# Patient Record
Sex: Female | Born: 1995 | Race: Black or African American | Hispanic: No | Marital: Single | Smoking: Never smoker
Health system: Southern US, Community
[De-identification: ages and names within clinical notes are randomized; demographics above are authoritative.]

## PROBLEM LIST (undated history)

## (undated) HISTORY — PX: WISDOM TOOTH EXTRACTION: SHX21

---

## 2016-06-11 ENCOUNTER — Encounter (HOSPITAL_COMMUNITY): Payer: Self-pay

## 2016-06-11 ENCOUNTER — Ambulatory Visit (HOSPITAL_COMMUNITY)
Admission: EM | Admit: 2016-06-11 | Discharge: 2016-06-11 | Disposition: A | Discharge status: Home / Self Care | Payer: BLUE CROSS/BLUE SHIELD | Attending: Radiology | Admitting: Radiology

## 2016-06-11 DIAGNOSIS — R6889 Other general symptoms and signs: Secondary | ICD-10-CM

## 2016-06-11 MED ORDER — METHYLPREDNISOLONE SODIUM SUCC 125 MG IJ SOLR
INTRAMUSCULAR | Status: AC
  Filled 2016-06-11: qty 2

## 2016-06-11 MED ORDER — METHYLPREDNISOLONE SODIUM SUCC 125 MG IJ SOLR
80.0000 mg | Freq: Once | INTRAMUSCULAR | Status: AC
  Administered 2016-06-11: 80 mg via INTRAMUSCULAR

## 2016-06-11 NOTE — ED Triage Notes (Signed)
[facility] since last night 06/10/2016, pt has taken Thera Flu to treat symptoms but has no relief.

## 2016-06-11 NOTE — Discharge Instructions (Signed)
Continue to push fluids and take over the counter medications as directed on the back of the box for symptomatic relief.  ° °

## 2016-06-11 NOTE — ED Notes (Signed)
Patient discharged by provider.

## 2016-06-11 NOTE — ED Provider Notes (Signed)
CSN: [id number]     Arrival date & time 06/11/16  1656 History   None    Chief Complaint  Patient presents with  . Influenza   (Consider location/radiation/quality/duration/timing/severity/associated sxs/prior Treatment) 21 y.o. female presents with sudden onset of headache, generalized muscles aches and sujective fever that occurred at 21:00 last night Condition is acute in nature. Condition is made better by nothing. Condition is made worse by nothing. Patient denies any relief from OTC medications prior to there arrival at this facility. Patient declines tamiflu at this time       History reviewed. No pertinent past medical history. History reviewed. No pertinent surgical history. History reviewed. No pertinent family history. Social History  Substance Use Topics  . Smoking status: Never Smoker  . Smokeless tobacco: Never Used  . Alcohol use No   OB History    No data available     Review of Systems  Constitutional: Positive for chills, diaphoresis and fever.  HENT: Positive for congestion. Negative for ear pain and sore throat.   Eyes: Negative for pain and visual disturbance.  Respiratory: Negative for cough and shortness of breath.   Cardiovascular: Negative for chest pain and palpitations.  Gastrointestinal: Negative for abdominal pain and vomiting.  Genitourinary: Negative for dysuria and hematuria.  Musculoskeletal: Negative for arthralgias and back pain.  Skin: Negative for color change and rash.  Neurological: Positive for headaches. Negative for seizures and syncope.  All other systems reviewed and are negative.   Allergies  Penicillins  Home Medications   Prior to Admission medications   Not on File   Meds Ordered and Administered this Visit   Medications  methylPREDNISolone sodium succinate (SOLU-MEDROL) 125 mg/2 mL injection 80 mg (80 mg Intramuscular Given 06/11/16 1816)    BP 94/80 (BP Location: Right Arm)   Pulse 96   Temp 97.6 F (36.4 C)  (Oral)   Resp 17   LMP 06/04/2016 (Exact Date)   SpO2 100%  No data found.   Physical Exam  Constitutional: She is oriented to person, place, and time. She appears well-developed and well-nourished.  HENT:  Head: Normocephalic and atraumatic.  Eyes: Conjunctivae are normal.  Neck: Normal range of motion.  Cardiovascular: Normal rate and regular rhythm.   Pulmonary/Chest: Effort normal and breath sounds normal.  Musculoskeletal: Normal range of motion.  Neurological: She is alert and oriented to person, place, and time.  Skin: Skin is warm.  Psychiatric: She has a normal mood and affect.  Nursing note and vitals reviewed.   Urgent Care Course     Procedures (including critical care time)  Labs Review Labs Reviewed - No data to display  Imaging Review No results found.      MDM   1. Flu-like symptoms       Alene MiresJennifer C Kaleel Schmieder, NP 06/11/16 1844

## 2016-06-15 ENCOUNTER — Ambulatory Visit (HOSPITAL_COMMUNITY)
Admission: EM | Admit: 2016-06-15 | Discharge: 2016-06-15 | Disposition: A | Discharge status: Home / Self Care | Payer: BLUE CROSS/BLUE SHIELD | Attending: Family Medicine | Admitting: Family Medicine

## 2016-06-15 ENCOUNTER — Encounter (HOSPITAL_COMMUNITY): Payer: Self-pay | Admitting: Emergency Medicine

## 2016-06-15 DIAGNOSIS — J069 Acute upper respiratory infection, unspecified: Secondary | ICD-10-CM

## 2016-06-15 DIAGNOSIS — B9789 Other viral agents as the cause of diseases classified elsewhere: Secondary | ICD-10-CM | POA: Diagnosis not present

## 2016-06-15 NOTE — ED Triage Notes (Signed)
Pt c/o cold sx onset: 5 days  Sx include: HA, chest tightness, ST, BA  Denies: fevers  Taking: OTC cold meds w/temp relief.   A&O x4... NAD

## 2016-06-15 NOTE — Discharge Instructions (Signed)
You most likely have a viral URI, I advise rest, plenty of fluids and management of symptoms with over the counter medicines. For symptoms you may take Tylenol as needed every 4-6 hours for body aches or fever, not to exceed 4,000 mg a day, Take mucinex or mucinex DM ever 12 hours with a full glass of water, you may use an inhaled steroid such as Flonase, 2 sprays each nostril once a day for congestion, or an antihistamine such as Claritin or Zyrtec once a day. Should your symptoms worsen or fail to resolve, follow up with your primary care provider or return to clinic.  °

## 2016-06-15 NOTE — ED Provider Notes (Signed)
CSN: [id number]     Arrival date & time 06/15/16  1451 History   First MD Initiated Contact with Patient 06/15/16 1533     Chief Complaint  Patient presents with  . URI   (Consider location/radiation/quality/duration/timing/severity/associated sxs/prior Treatment) 21 year old female presents for reevaluation for flu like symptoms. She was seen on 06/11/2016 diagnosis flulike symptoms, stated she is returning to clinic today, as she is not feeling any better.   The history is provided by the patient.  URI  Presenting symptoms: congestion, cough, fatigue, fever and rhinorrhea   Presenting symptoms: no ear pain, no facial pain and no sore throat   Congestion:    Location:  Nasal   Interferes with sleep: no     Interferes with eating/drinking: no   Cough:    Cough characteristics:  Non-productive and hacking   Sputum characteristics:  Clear   Severity:  Moderate   Onset quality:  Gradual   Duration:  7 days   Timing:  Intermittent   Progression:  Worsening   Chronicity:  New Severity:  Mild Onset quality:  Gradual Duration:  7 days Timing:  Constant Progression:  Unchanged Chronicity:  New Relieved by:  None tried Worsened by:  Nothing Ineffective treatments:  None tried Associated symptoms: headaches and myalgias   Associated symptoms: no arthralgias, no neck pain, no sinus pain, no sneezing, no swollen glands and no wheezing     History reviewed. No pertinent past medical history. History reviewed. No pertinent surgical history. History reviewed. No pertinent family history. Social History  Substance Use Topics  . Smoking status: Never Smoker  . Smokeless tobacco: Never Used  . Alcohol use No   OB History    No data available     Review of Systems  Constitutional: Positive for fatigue and fever. Negative for chills.  HENT: Positive for congestion and rhinorrhea. Negative for ear discharge, ear pain, sinus pain, sinus pressure, sneezing and sore throat.   Eyes:  Negative.   Respiratory: Positive for cough. Negative for choking, shortness of breath and wheezing.   Cardiovascular: Negative.   Gastrointestinal: Negative for abdominal pain, diarrhea, nausea and vomiting.  Genitourinary: Negative.   Musculoskeletal: Positive for myalgias. Negative for arthralgias, back pain, neck pain and neck stiffness.  Skin: Negative.   Neurological: Positive for headaches. Negative for dizziness, syncope, weakness and light-headedness.  All other systems reviewed and are negative.   Allergies  Penicillins  Home Medications   Prior to Admission medications   Not on File   Meds Ordered and Administered this Visit  Medications - No data to display  BP 115/79 (BP Location: Right Arm)   Pulse 89   Temp 98.6 F (37 C) (Oral)   Resp 16   LMP 06/04/2016 (Exact Date)   SpO2 99%  No data found.   Physical Exam  Constitutional: She is oriented to person, place, and time. She appears well-developed and well-nourished. She does not have a sickly appearance. She does not appear ill. No distress.  HENT:  Head: Normocephalic and atraumatic.  Right Ear: Tympanic membrane and external ear normal.  Left Ear: Tympanic membrane and external ear normal.  Nose: Rhinorrhea present. Right sinus exhibits no maxillary sinus tenderness and no frontal sinus tenderness. Left sinus exhibits no maxillary sinus tenderness and no frontal sinus tenderness.  Mouth/Throat: Uvula is midline and oropharynx is clear and moist. No oropharyngeal exudate.  Eyes: Pupils are equal, round, and reactive to light.  Neck: Normal range of motion. Neck supple.  No JVD present.  Cardiovascular: Normal rate and regular rhythm.   Pulmonary/Chest: Effort normal and breath sounds normal. No respiratory distress. She has no wheezes.  Abdominal: Soft. Bowel sounds are normal. She exhibits no distension. There is no tenderness. There is no guarding.  Lymphadenopathy:    She has no cervical adenopathy.   Neurological: She is alert and oriented to person, place, and time.  Skin: Skin is warm and dry. Capillary refill takes less than 2 seconds. She is not diaphoretic.  Psychiatric: She has a normal mood and affect. Her behavior is normal.  Nursing note and vitals reviewed.   Urgent Care Course     Procedures (including critical care time)  Labs Review Labs Reviewed - No data to display  Imaging Review No results found.      MDM   1. Viral upper respiratory tract infection     Provided counseling on the use of over-the-counter medicines to treat symptoms. Provided school note for the remainder of the week as well. Advised to continue to have these symptoms for several more days, however she will eventually get better. If at any time symptoms worsen, recommend going to the emergency room or returning to clinic as needed.     Dorena BodoLawrence Dekker Verga, NP 06/15/16 1550

## 2016-06-19 ENCOUNTER — Emergency Department (HOSPITAL_COMMUNITY): Payer: BLUE CROSS/BLUE SHIELD

## 2016-06-19 ENCOUNTER — Emergency Department (HOSPITAL_COMMUNITY)
Admission: EM | Admit: 2016-06-19 | Discharge: 2016-06-20 | Disposition: A | Discharge status: Home / Self Care | Payer: BLUE CROSS/BLUE SHIELD | Attending: Emergency Medicine | Admitting: Emergency Medicine

## 2016-06-19 ENCOUNTER — Encounter (HOSPITAL_COMMUNITY): Payer: Self-pay | Admitting: Emergency Medicine

## 2016-06-19 DIAGNOSIS — R945 Abnormal results of liver function studies: Secondary | ICD-10-CM | POA: Diagnosis not present

## 2016-06-19 DIAGNOSIS — R05 Cough: Secondary | ICD-10-CM | POA: Diagnosis present

## 2016-06-19 DIAGNOSIS — R748 Abnormal levels of other serum enzymes: Secondary | ICD-10-CM

## 2016-06-19 DIAGNOSIS — B349 Viral infection, unspecified: Secondary | ICD-10-CM

## 2016-06-19 DIAGNOSIS — R7989 Other specified abnormal findings of blood chemistry: Secondary | ICD-10-CM

## 2016-06-19 LAB — URINALYSIS, ROUTINE W REFLEX MICROSCOPIC
Bilirubin Urine: NEGATIVE
Glucose, UA: NEGATIVE mg/dL
Hgb urine dipstick: NEGATIVE
Ketones, ur: NEGATIVE mg/dL
Leukocytes, UA: NEGATIVE
Nitrite: NEGATIVE
Protein, ur: NEGATIVE mg/dL
Specific Gravity, Urine: 1.004 — ABNORMAL LOW (ref 1.005–1.030)
pH: 7 (ref 5.0–8.0)

## 2016-06-19 LAB — CBC WITH DIFFERENTIAL/PLATELET
Basophils Absolute: 0 10*3/uL (ref 0.0–0.1)
Basophils Relative: 0 %
Eosinophils Absolute: 0.2 10*3/uL (ref 0.0–0.7)
Eosinophils Relative: 4 %
HCT: 38.7 % (ref 36.0–46.0)
Hemoglobin: 13 g/dL (ref 12.0–15.0)
Lymphocytes Relative: 40 %
Lymphs Abs: 2.3 10*3/uL (ref 0.7–4.0)
MCH: 29 pg (ref 26.0–34.0)
MCHC: 33.6 g/dL (ref 30.0–36.0)
MCV: 86.2 fL (ref 78.0–100.0)
Monocytes Absolute: 0.6 10*3/uL (ref 0.1–1.0)
Monocytes Relative: 10 %
Neutro Abs: 2.6 10*3/uL (ref 1.7–7.7)
Neutrophils Relative %: 46 %
Platelets: 292 10*3/uL (ref 150–400)
RBC: 4.49 MIL/uL (ref 3.87–5.11)
RDW: 12.5 % (ref 11.5–15.5)
WBC: 5.7 10*3/uL (ref 4.0–10.5)

## 2016-06-19 LAB — COMPREHENSIVE METABOLIC PANEL
ALT: 71 U/L — ABNORMAL HIGH (ref 14–54)
AST: 50 U/L — ABNORMAL HIGH (ref 15–41)
Albumin: 4.3 g/dL (ref 3.5–5.0)
Alkaline Phosphatase: 92 U/L (ref 38–126)
Anion gap: 8 (ref 5–15)
BUN: 5 mg/dL — ABNORMAL LOW (ref 6–20)
CO2: 28 mmol/L (ref 22–32)
Calcium: 9.6 mg/dL (ref 8.9–10.3)
Chloride: 102 mmol/L (ref 101–111)
Creatinine, Ser: 0.66 mg/dL (ref 0.44–1.00)
GFR calc Af Amer: >60 mL/min (ref >60)
GFR calc non Af Amer: >60 mL/min (ref >60)
Glucose, Bld: 89 mg/dL (ref 65–99)
Potassium: 3.4 mmol/L — ABNORMAL LOW (ref 3.5–5.1)
Sodium: 138 mmol/L (ref 135–145)
Total Bilirubin: 0.7 mg/dL (ref 0.3–1.2)
Total Protein: 8.2 g/dL — ABNORMAL HIGH (ref 6.5–8.1)

## 2016-06-19 LAB — PREGNANCY, URINE: Preg Test, Ur: NEGATIVE

## 2016-06-19 MED ORDER — SODIUM CHLORIDE 0.9 % IV BOLUS (SEPSIS): 1000.0000 mL | Freq: Once | INTRAVENOUS | Status: DC

## 2016-06-19 MED ORDER — SODIUM CHLORIDE 0.9 % IV SOLN
1000.0000 mL | Freq: Once | INTRAVENOUS | Status: AC
  Administered 2016-06-19: 1000 mL via INTRAVENOUS

## 2016-06-19 MED ORDER — ONDANSETRON HCL 4 MG/2ML IJ SOLN
4.0000 mg | Freq: Once | INTRAMUSCULAR | Status: AC
Start: 2016-06-19 — End: 2016-06-19
  Administered 2016-06-19: 4 mg via INTRAVENOUS
  Filled 2016-06-19: qty 2

## 2016-06-19 MED ORDER — SODIUM CHLORIDE 0.9 % IV BOLUS (SEPSIS)
1000.0000 mL | Freq: Once | INTRAVENOUS | Status: AC
Start: 2016-06-19 — End: 2016-06-19
  Administered 2016-06-19: 1000 mL via INTRAVENOUS

## 2016-06-19 MED ORDER — IBUPROFEN 600 MG PO TABS: 600.0000 mg | ORAL_TABLET | Freq: Four times a day (QID) | ORAL | 0 refills | Status: DC | PRN

## 2016-06-19 MED ORDER — ONDANSETRON 4 MG PO TBDP: 4.0000 mg | ORAL_TABLET | ORAL | 0 refills | Status: DC | PRN

## 2016-06-19 NOTE — ED Provider Notes (Signed)
[facility]-EMERGENCY DEPT Provider Note   CSN: [id number] Arrival date & time: 06/19/16  7829     History   Chief Complaint Chief Complaint  Patient presents with  . URI    HPI Teresa Arroyo is a 21 y.o. female.  HPI Patient reports she has been sick for approximately a week. Initial symptoms included cough and chest congestion. She reports she also developed vomiting and diarrhea. Cough has resolved. Last fever was 2 days ago. Patient reports she continues to have diarrheal stool which is lime green in appearance. He estimates 4 per day. He also reports that she vomits if she tries to eat or drink. She states that she has had some abdominal pain but that has not been a prominent symptom. She tried over-the-counter medications initially but has not seen any improvement. She for she has also had body aches. She is a Consulting civil engineer. She reports she is unaware of sick contacts. History reviewed. No pertinent past medical history.  There are no active problems to display for this patient.   Past Surgical History:  Procedure Laterality Date  . WISDOM TOOTH EXTRACTION      OB History    No data available       Home Medications    Prior to Admission medications   Medication Sig Start Date End Date Taking? Authorizing Provider  PE-GG-APAP & PE-DPH-APAP (MUCINEX SINUS-MAX DAY/NIGHT) Liquid MISC Take 30 mLs by mouth daily as needed (cough).   Yes Historical Provider, MD  PREVIFEM 0.25-35 MG-MCG tablet Take 1 tablet by mouth at bedtime.  06/12/16  Yes Historical Provider, MD  ibuprofen (ADVIL,MOTRIN) 600 MG tablet Take 1 tablet (600 mg total) by mouth every 6 (six) hours as needed. 06/20/16   Arby Barrette, MD  ondansetron (ZOFRAN ODT) 4 MG disintegrating tablet Take 1 tablet (4 mg total) by mouth every 4 (four) hours as needed for nausea or vomiting. 06/20/16   Arby Barrette, MD    Family History No family history on file.  Social History Social History  Substance Use Topics  . Smoking  status: Never Smoker  . Smokeless tobacco: Never Used  . Alcohol use No     Allergies   Penicillins   Review of Systems Review of Systems 10 Systems reviewed and are negative for acute change except as noted in the HPI.  Physical Exam Updated Vital Signs BP 98/61 (BP Location: Right Arm)   Pulse 82   Temp 98.6 F (37 C) (Oral)   Resp 18   Ht 5\' 1"  (1.549 m)   Wt 125 lb (56.7 kg)   LMP 06/11/2016   SpO2 95%   BMI 23.62 kg/m   Physical Exam  Constitutional: She is oriented to person, place, and time. She appears well-developed and well-nourished. No distress.  HENT:  Head: Normocephalic and atraumatic.  Nose: Nose normal.  Mouth/Throat: Oropharynx is clear and moist.  Eyes: Conjunctivae and EOM are normal.  Neck: Neck supple.  Cardiovascular: Normal rate, regular rhythm, normal heart sounds and intact distal pulses.   No murmur heard. Pulmonary/Chest: Effort normal and breath sounds normal. No respiratory distress.  Abdominal: Soft. She exhibits no distension. There is tenderness. There is no guarding.  Very mild right upper back/mid quadrant tenderness without guarding.  Musculoskeletal: She exhibits no edema or tenderness.  Neurological: She is alert and oriented to person, place, and time. No cranial nerve deficit. She exhibits normal muscle tone. Coordination normal.  Skin: Skin is warm and dry.  Psychiatric: She has a  normal mood and affect.  Nursing note and vitals reviewed.    ED Treatments / Results  Labs (all labs ordered are listed, but only abnormal results are displayed) Labs Reviewed  COMPREHENSIVE METABOLIC PANEL - Abnormal; Notable for the following:       Result Value   Potassium 3.4 (*)    BUN 5 (*)    Total Protein 8.2 (*)    AST 50 (*)    ALT 71 (*)    All other components within normal limits  URINALYSIS, ROUTINE W REFLEX MICROSCOPIC - Abnormal; Notable for the following:    Color, Urine STRAW (*)    Specific Gravity, Urine 1.004 (*)      All other components within normal limits  CBC WITH DIFFERENTIAL/PLATELET  PREGNANCY, URINE  HEPATITIS PANEL, ACUTE    EKG  EKG Interpretation None       Radiology Dg Chest 2 View  Result Date: 06/19/2016 CLINICAL DATA:  Intermittent moderate cough for 1 week, diagnosed with influenza in URI, cough productive, subjective fever, decreased appetite, chest tightness, ear pain, RIGHT lower quadrant abdominal pain and burning, nausea and vomiting EXAM: CHEST  2 VIEW COMPARISON:  None FINDINGS: Normal heart size, mediastinal contours, and pulmonary vascularity. Lungs minimally hyperinflated but clear. No pleural effusion or pneumothorax. Bones unremarkable. IMPRESSION: Minimal hyperinflation without acute infiltrate. Electronically Signed   By: Ulyses SouthwardMark  Boles M.D.   On: 06/19/2016 19:26   [place] Abdomen Limited Ruq  Result Date: 06/19/2016 CLINICAL DATA:  Nausea, vomiting, diarrhea 5 days EXAM: US ABDOMEN LIMITED - RIGHT UPPER QUADRANT COMPARISON:  None. FINDINGS: Gallbladder: Contracted gallbladder. Gallbladder sludge versus less likely tiny cholelithiasis. No pericholecystic fluid or wall thickening visualized. No sonographic Murphy sign noted by sonographer. Common bile duct: Diameter: 3.2 mm Liver: No focal lesion identified. Within normal limits in parenchymal echogenicity. IMPRESSION: Gallbladder sludge versus less likely tiny cholelithiasis. No sonographic evidence of acute cholecystitis. Electronically Signed   By: Elige KoHetal  Patel   On: 06/19/2016 21:39    Procedures Procedures (including critical care time)  Medications Ordered in ED Medications  sodium chloride 0.9 % bolus 1,000 mL (0 mLs Intravenous Stopped 06/19/16 2042)  ondansetron (ZOFRAN) injection 4 mg (4 mg Intravenous Given 06/19/16 1940)  0.9 %  sodium chloride infusion (0 mLs Intravenous Stopped 06/19/16 2155)     Initial Impression / Assessment and Plan / ED Course  I have reviewed the triage vital signs and the nursing  notes.  Pertinent labs & imaging results that were available during my care of the patient were reviewed by me and considered in my medical decision making (see chart for details).      Final Clinical Impressions(s) / ED Diagnoses   Final diagnoses:  Elevated liver enzymes  Viral syndrome   Patient has had a week's worth of viral type symptoms. Respiratory symptoms have resolved and patient now has vomiting if she eats. Very minimal elevation in LFTs. Mild right upper quadrant tenderness, no evidence of cholecystitis by ultrasound. At this time I doubt the etiology of this is cholecystitis or biliary in origin. I suspect viral syndrome. Patient is clinically well in appearance. At this time feel she is stable to continue ibuprofen for body aches and pain and zofran for nausea. Patient is counseled on follow-up plan and the need for recheck of LFTs for resolution 1 symptoms are resolved. New Prescriptions Current Discharge Medication List    START taking these medications   Details  ibuprofen (ADVIL,MOTRIN) 600 MG tablet Take  1 tablet (600 mg total) by mouth every 6 (six) hours as needed. Qty: 30 tablet, Refills: 0    ondansetron (ZOFRAN ODT) 4 MG disintegrating tablet Take 1 tablet (4 mg total) by mouth every 4 (four) hours as needed for nausea or vomiting. Qty: 20 tablet, Refills: 0         Arby Barrette, MD 06/24/16 1459

## 2016-06-19 NOTE — ED Provider Notes (Signed)
MSE was initiated and I personally evaluated the patient and placed orders (if any) at  7:07 PM on June 19, 2016.  HPI provided by scribe, Modena JanskyAlbert Thayil  I personally performed the services described in this documentation, which was scribed in my presence. The recorded information has been reviewed and is accurate.  Teresa Arroyo is a 21 y.o. female who presents to the Emergency Department complaining of intermittent moderate cough that started about a week ago. She was seen in the Urgent Care and diagnosed with Influenza and URI. She was given Methylprednisolone injection , but did not have a chest-xray then. She took Mucinex, Theraflu, and Advil with minimal relief. She describes the cough as productive. She reports associated subjective fever, decreased appetite, ear pain, chest tightness, RLQ abdominal (intermittent, burning), nausea, vomiting, non-bloody diarrhea, generalized myalgias, and room-spinning dizziness (with ambulation and standing). Patient's nausea, vomiting, and diarrhea began 5 days ago. Patient states she has not been able to keep any food down for 5 days. She has been able to keep fluids down. Pt's temperature in the ED today was 98.2. She denies any abdominal pain, new vaginal bleeding/discharge, pelvic pain, urinary symptoms, or other complaints.   Decreased lung sounds on exam. Patient has right lower quadrant tenderness and may require imaging. Patient states she has never had a pelvic exam and has never been sexually active.  I have initiated patient work up with basic labs, urine chest x-ray, IV fluids and Zofran.   The patient appears stable so that the remainder of the MSE may be completed by another provider.     Emi Holeslexandra M Wessie Shanks, PA-C 06/19/16 1912    Arby BarretteMarcy Pfeiffer, MD 06/25/16 [phone]

## 2016-06-19 NOTE — Discharge Instructions (Addendum)
Your liver function labs are mildly elevated. You will need to follow-up with a family doctor to recheck these and to follow up on the results of your hepatitis screening in the emergency department.

## 2016-06-19 NOTE — ED Triage Notes (Signed)
Patient is complaining of lack of appetite, nausea and productive cough for over 1 week. Patient has been dx with flu and URI.

## 2016-06-19 NOTE — ED Notes (Signed)
Provider has been to assess patient feels like she needs to be triaged up for treatment. Pt is being moved to room 8 for further treatment.

## 2016-06-19 NOTE — ED Notes (Signed)
Asked pt if she could give a urine sample, but said she's not able to. 

## 2016-06-20 ENCOUNTER — Encounter (HOSPITAL_COMMUNITY): Payer: Self-pay | Admitting: Emergency Medicine

## 2016-06-20 ENCOUNTER — Emergency Department (HOSPITAL_COMMUNITY)
Admission: EM | Admit: 2016-06-20 | Discharge: 2016-06-21 | Disposition: A | Discharge status: Home / Self Care | Payer: BLUE CROSS/BLUE SHIELD | Source: Home / Self Care | Attending: Emergency Medicine | Admitting: Emergency Medicine

## 2016-06-20 ENCOUNTER — Emergency Department (HOSPITAL_COMMUNITY): Payer: BLUE CROSS/BLUE SHIELD

## 2016-06-20 DIAGNOSIS — R0789 Other chest pain: Secondary | ICD-10-CM

## 2016-06-20 DIAGNOSIS — R55 Syncope and collapse: Secondary | ICD-10-CM

## 2016-06-20 DIAGNOSIS — Z79899 Other long term (current) drug therapy: Secondary | ICD-10-CM

## 2016-06-20 LAB — BASIC METABOLIC PANEL
ANION GAP: 7 (ref 5–15)
BUN: 6 mg/dL (ref 6–20)
CALCIUM: 8.9 mg/dL (ref 8.9–10.3)
CO2: 25 mmol/L (ref 22–32)
CREATININE: 0.72 mg/dL (ref 0.44–1.00)
Chloride: 105 mmol/L (ref 101–111)
Glucose, Bld: 100 mg/dL — ABNORMAL HIGH (ref 65–99)
Potassium: 3.8 mmol/L (ref 3.5–5.1)
SODIUM: 137 mmol/L (ref 135–145)

## 2016-06-20 LAB — D-DIMER, QUANTITATIVE: D-Dimer, Quant: <0.27 ug/mL-FEU (ref 0.00–0.50)

## 2016-06-20 LAB — CBG MONITORING, ED: Glucose-Capillary: 90 mg/dL (ref 65–99)

## 2016-06-20 LAB — CBC
HEMATOCRIT: 36.9 % (ref 36.0–46.0)
HEMOGLOBIN: 12.2 g/dL (ref 12.0–15.0)
MCH: 29.2 pg (ref 26.0–34.0)
MCHC: 33.1 g/dL (ref 30.0–36.0)
MCV: 88.3 fL (ref 78.0–100.0)
Platelets: 273 10*3/uL (ref 150–400)
RBC: 4.18 MIL/uL (ref 3.87–5.11)
RDW: 12.6 % (ref 11.5–15.5)
WBC: 6.5 10*3/uL (ref 4.0–10.5)

## 2016-06-20 LAB — I-STAT BETA HCG BLOOD, ED (MC, WL, AP ONLY)

## 2016-06-20 LAB — HEPATIC FUNCTION PANEL
ALBUMIN: 3.8 g/dL (ref 3.5–5.0)
ALK PHOS: 75 U/L (ref 38–126)
ALT: 81 U/L — AB (ref 14–54)
AST: 62 U/L — AB (ref 15–41)
BILIRUBIN TOTAL: 0.5 mg/dL (ref 0.3–1.2)
Bilirubin, Direct: 0.2 mg/dL (ref 0.1–0.5)
Indirect Bilirubin: 0.3 mg/dL (ref 0.3–0.9)
TOTAL PROTEIN: 7.2 g/dL (ref 6.5–8.1)

## 2016-06-20 LAB — I-STAT TROPONIN, ED: TROPONIN I, POC: 0 ng/mL (ref 0.00–0.08)

## 2016-06-20 MED ORDER — IBUPROFEN 600 MG PO TABS: 600.0000 mg | ORAL_TABLET | Freq: Four times a day (QID) | ORAL | 0 refills | Status: AC | PRN

## 2016-06-20 MED ORDER — ONDANSETRON 4 MG PO TBDP: 4.0000 mg | ORAL_TABLET | ORAL | 0 refills | Status: DC | PRN

## 2016-06-20 NOTE — ED Provider Notes (Deleted)
[facility] Provider Note    By signing my name below, I, Teresa Arroyo, attest that this documentation has been prepared under the direction and in the presence of Arthor Captain, PA-C. Electronically Signed: Earmon Arroyo, ED Scribe. 06/21/16. 12:39 AM.    History   Chief Complaint Chief Complaint  Patient presents with  . Chest Pain  . Loss of Consciousness    The history is provided by the patient and medical records. No language interpreter was used.    Teresa Arroyo is a 21 y.o. female who presents to the Emergency Department complaining of sudden onset of chest tightness that began approximately 1.5 hours ago. She reports associated near syncope and sweating hands. Pt was seen here yesterday for URI symptoms after being seen at [facility] 5 days ago and was diagnosed with the flu. She was prescribed Zofran and Ibuprofen at discharge yesterday. She reports improving URI symptoms but reports a continued productive cough. She reports having a green bowel movement yesterday but denies a bowel movement today. She has not taken anything for pain relief. There are no modifying factors noted. She denies SOB, calf swelling, fever, chills. She reports taking oral contraceptive pills. She denies smoking. She denies h/o DVT.   History reviewed. No pertinent past medical history.  There are no active problems to display for this patient.   Past Surgical History:  Procedure Laterality Date  . WISDOM TOOTH EXTRACTION      OB History    No data available       Home Medications    Prior to Admission medications   Medication Sig Start Date End Date Taking? Authorizing Provider  ibuprofen (ADVIL,MOTRIN) 600 MG tablet Take 1 tablet (600 mg total) by mouth every 6 (six) hours as needed. Patient taking differently: Take 600 mg by mouth every 6 (six) hours as needed for moderate pain.  06/20/16  Yes Arby Barrette, MD  PREVIFEM 0.25-35 MG-MCG tablet Take 1 tablet by mouth at bedtime.   06/12/16  Yes Historical Provider, MD  ondansetron (ZOFRAN ODT) 4 MG disintegrating tablet Take 1 tablet (4 mg total) by mouth every 4 (four) hours as needed for nausea or vomiting. 06/20/16   Arby Barrette, MD    Family History History reviewed. No pertinent family history.  Social History Social History  Substance Use Topics  . Smoking status: Never Smoker  . Smokeless tobacco: Never Used  . Alcohol use No     Allergies   Penicillins   Review of Systems Review of Systems A complete 10 system review of systems was obtained and all systems are negative except as noted in the HPI and PMH.    Physical Exam Updated Vital Signs BP 113/78 (BP Location: Left Arm)   Pulse 84   Temp 98.7 F (37.1 C) (Oral)   Resp 18   Ht 5\' 1"  (1.549 m)   Wt 125 lb (56.7 kg)   LMP 06/11/2016   SpO2 98%   BMI 23.62 kg/m   Physical Exam  Constitutional: She is oriented to person, place, and time. She appears well-developed and well-nourished.  HENT:  Head: Normocephalic and atraumatic.  Neck: Normal range of motion.  Cardiovascular: Normal rate, regular rhythm and normal heart sounds.  Exam reveals no gallop and no friction rub.   No murmur heard. Pulmonary/Chest: Effort normal and breath sounds normal. No respiratory distress. She has no wheezes. She has no rales.  Abdominal: Soft. Bowel sounds are normal. There is no tenderness.  Musculoskeletal: Normal range  of motion.  Neurological: She is alert and oriented to person, place, and time.  Skin: Skin is warm and dry.  Psychiatric: She has a normal mood and affect. Her behavior is normal.  Nursing note and vitals reviewed.    ED Treatments / Results  DIAGNOSTIC STUDIES: Oxygen Saturation is 98% on RA, normal by my interpretation.   COORDINATION OF CARE: 11:48 PM- Will order labs. Pt verbalizes understanding and agrees to plan.  Medications - No data to display   Labs (all labs ordered are listed, but only abnormal results are  displayed) Labs Reviewed  BASIC METABOLIC PANEL - Abnormal; Notable for the following:       Result Value   Glucose, Bld 100 (*)    All other components within normal limits  HEPATIC FUNCTION PANEL - Abnormal; Notable for the following:    AST 62 (*)    ALT 81 (*)    All other components within normal limits  CBC  D-DIMER, QUANTITATIVE (NOT AT [facility])  I-STAT TROPOININ, ED  I-STAT BETA HCG BLOOD, ED ([facility], [facility], [facility] ONLY)  POCT CBG (FASTING - GLUCOSE)-MANUAL ENTRY  CBG MONITORING, ED    EKG  EKG Interpretation  Date/Time:  Tuesday June 20 2016 23:51:50 EDT Ventricular Rate:  79 PR Interval:    QRS Duration: 90 QT Interval:  365 QTC Calculation: 419 R Axis:   92 Text Interpretation:  Sinus rhythm Borderline right axis deviation Q waves in lateral leads Confirmed by DELO  MD, DOUGLAS ([id number]) on 06/21/2016 12:09:45 AM       Radiology No results found.  Procedures Procedures (including critical care time)  Medications Ordered in ED Medications - No data to display   Initial Impression / Assessment and Plan / ED Course  I have reviewed the triage vital signs and the nursing notes.  Pertinent labs & imaging results that were available during my care of the patient were reviewed by me and considered in my medical decision making (see chart for details).    Final Clinical Impressions(s) / ED Diagnoses   Final diagnoses:  Chest discomfort  Near syncope  Patient is has symptoms consistent with viral syndrome possibly influenza. She reports she developed a feeling of chest tightness and shortness of breath with a feeling like she might pass out. Patient does persist in having some diarrheal illness and productive cough. Clinically she is well in appearance. Diagnostic studies are within normal limits. I do not suspect significant dehydration or secondary bacterial illness at this time. Patient may have some ongoing pleurisy and chest discomfort from recent illness. Plan will be  for continued symptomatic management with ibuprofen for chest wall pain and Zofran if needed for nausea. Return precautions are reviewed.  New Prescriptions Discharge Medication List as of 06/21/2016  2:12 AM       Arby BarretteMarcy Bertie Simien, MD 06/24/16 1354

## 2016-06-20 NOTE — ED Triage Notes (Signed)
Pt reports having substernal chest pain that began tonight after having syncopal episode. Pt stated that she had passed out for appx 10 min. Pt currently alert and oriented x 4 at this time.

## 2016-06-21 LAB — HEPATITIS PANEL, ACUTE
HEP B C IGM: NEGATIVE
Hep A IgM: NEGATIVE
Hepatitis B Surface Ag: NEGATIVE

## 2016-06-21 NOTE — Discharge Instructions (Signed)
Your work up was negative today.  Get help right away if: You have a severe headache. You have unusual pain in your chest, abdomen, or back. You are bleeding from your mouth or rectum, or you have black or tarry stool. You have a very fast or irregular heartbeat (palpitations). You faint once or repeatedly. You have a seizure. You are confused. You have trouble walking. You have severe weakness. You have vision problems.

## 2016-06-24 NOTE — ED Provider Notes (Signed)
[facility]ER[facility] Provider Note    By signing my name below, I, [name], attest that this documentation has been prepared under the direction and in the presence of Arthor Captain, PA-C. Electronically Signed: [name], ED Scribe. 06/21/16. 12:39 AM.    History   Chief Complaint Chief Complaint  Patient presents with  . Chest Pain  . Loss of Consciousness    The history is provided by the patient and medical records. No language interpreter was used.  Chest Pain   Associated symptoms include syncope.  Loss of Consciousness   Associated symptoms include chest pain.    Teresa Arroyo is a 21 y.o. female who presents to the Emergency Department complaining of sudden onset of chest tightness that began approximately 1.5 hours ago. She reports associated near syncope and sweating hands. Pt was seen here yesterday for URI symptoms after being seen at [facility] 5 days ago and was diagnosed with the flu. She was prescribed Zofran and Ibuprofen at discharge yesterday. She reports improving URI symptoms but reports a continued productive cough. She reports having a green bowel movement yesterday but denies a bowel movement today. She has not taken anything for pain relief. There are no modifying factors noted. She denies SOB, calf swelling, fever, chills. She reports taking oral contraceptive pills. She denies smoking. She denies h/o DVT.   History reviewed. No pertinent past medical history.  There are no active problems to display for this patient.   Past Surgical History:  Procedure Laterality Date  . WISDOM TOOTH EXTRACTION      OB History    No data available       Home Medications    Prior to Admission medications   Medication Sig Start Date End Date Taking? Authorizing Provider  ibuprofen (ADVIL,MOTRIN) 600 MG tablet Take 1 tablet (600 mg total) by mouth every 6 (six) hours as needed. Patient taking differently: Take 600 mg by mouth every 6 (six) hours as needed  for moderate pain.  06/20/16  Yes Arby Barrette, MD  PREVIFEM 0.25-35 MG-MCG tablet Take 1 tablet by mouth at bedtime.  06/12/16  Yes Historical Provider, MD  ondansetron (ZOFRAN ODT) 4 MG disintegrating tablet Take 1 tablet (4 mg total) by mouth every 4 (four) hours as needed for nausea or vomiting. 06/20/16   Arby Barrette, MD    Family History History reviewed. No pertinent family history.  Social History Social History  Substance Use Topics  . Smoking status: Never Smoker  . Smokeless tobacco: Never Used  . Alcohol use No     Allergies   Penicillins   Review of Systems Review of Systems  Cardiovascular: Positive for chest pain and syncope.   A complete 10 system review of systems was obtained and all systems are negative except as noted in the HPI and PMH.    Physical Exam Updated Vital Signs BP 113/78 (BP Location: Left Arm)   Pulse 84   Temp 98.7 F (37.1 C) (Oral)   Resp 18   Ht  (1.549 m)   Wt 125 lb (56.7 kg)   LMP 06/11/2016   SpO2 98%   BMI 23.62 kg/m   Physical Exam  Constitutional: She is oriented to person, place, and time. She appears well-developed and well-nourished.  HENT:  Head: Normocephalic and atraumatic.  Neck: Normal range of motion.  Cardiovascular: Normal rate, regular rhythm and normal heart sounds.  Exam reveals no gallop and no friction rub.   No murmur heard. Pulmonary/Chest: Effort normal  and breath sounds normal. No respiratory distress. She has no wheezes. She has no rales.  Abdominal: Soft. Bowel sounds are normal. There is no tenderness.  Musculoskeletal: Normal range of motion.  Neurological: She is alert and oriented to person, place, and time.  Skin: Skin is warm and dry.  Psychiatric: She has a normal mood and affect. Her behavior is normal.  Nursing note and vitals reviewed.    ED Treatments / Results  DIAGNOSTIC STUDIES: Oxygen Saturation is 98% on RA, normal by my interpretation.   COORDINATION OF  CARE: 11:48 PM- Will order labs. Pt verbalizes understanding and agrees to plan.  Medications - No data to display   Labs (all labs ordered are listed, but only abnormal results are displayed) Labs Reviewed  BASIC METABOLIC PANEL - Abnormal; Notable for the following:       Result Value   Glucose, Bld 100 (*)    All other components within normal limits  HEPATIC FUNCTION PANEL - Abnormal; Notable for the following:    AST 62 (*)    ALT 81 (*)    All other components within normal limits  CBC  D-DIMER, QUANTITATIVE (NOT AT [facility])  I-STAT TROPOININ, ED  I-STAT BETA HCG BLOOD, ED ([facility], [facility], [facility] ONLY)  POCT CBG (FASTING - GLUCOSE)-MANUAL ENTRY  CBG MONITORING, ED    EKG  EKG Interpretation  Date/Time:  Tuesday June 20 2016 23:51:50 EDT Ventricular Rate:  79 PR Interval:    QRS Duration: 90 QT Interval:  365 QTC Calculation: 419 R Axis:   92 Text Interpretation:  Sinus rhythm Borderline right axis deviation Q waves in lateral leads Confirmed by DELO  MD, DOUGLAS ([id number]) on 06/21/2016 12:09:45 AM       Radiology No results found.  Procedures Procedures (including critical care time)  Medications Ordered in ED Medications - No data to display   Initial Impression / Assessment and Plan / ED Course  I have reviewed the triage vital signs and the nursing notes.  Pertinent labs & imaging results that were available during my care of the patient were reviewed by me and considered in my medical decision making (see chart for details).    Final Clinical Impressions(s) / ED Diagnoses   Final diagnoses:  Chest discomfort  Near syncope  patient seen recently with viral  Illness. Noted to have elevated liver enzymed. Her Hepatitis panel is still pending. Repeat enzymes are only lightly more elevated. I discussed the need for repeat testing to ensure her transaminitis is resolving. Clinically she is well in appearance. Diagnostic studies are reviewed and reassuring.   Patient  may have some ongoing pleurisy and chest discomfort from recent illness. Return precautions are reviewed.  New Prescriptions Discharge Medication List as of 06/21/2016  2:12 AM           Arthor Captain, PA-C 06/24/16 1512    Geoffery Lyons, MD 06/24/16 [phone]

## 2017-01-15 ENCOUNTER — Ambulatory Visit (HOSPITAL_COMMUNITY)
Admission: EM | Admit: 2017-01-15 | Discharge: 2017-01-15 | Disposition: A | Discharge status: Home / Self Care | Payer: BLUE CROSS/BLUE SHIELD

## 2017-01-15 ENCOUNTER — Encounter (HOSPITAL_COMMUNITY): Payer: Self-pay | Admitting: Family Medicine

## 2017-01-15 DIAGNOSIS — R6889 Other general symptoms and signs: Secondary | ICD-10-CM | POA: Diagnosis not present

## 2017-01-15 NOTE — ED Triage Notes (Signed)
Pt here for URI symptoms.  

## 2017-01-15 NOTE — ED Provider Notes (Signed)
[facility]    CSN: [id number] Arrival date & time: 01/15/17  1421     History   Chief Complaint Chief Complaint  Patient presents with  . URI    HPI Aasia Peavler is a 21 y.o. female.   Subjective:   Sirenity Shew is a 21 y.o. female who presents for evaluation of influenza like symptoms. Symptoms include chills, headache, myalgias and sinus/nasal congestion and have been present for 2 days. She has tried to alleviate the symptoms with rest with minimal relief. She is drinking plenty of fluids. High risk factors for influenza complications: none. She is a Archivist. Resides on campus. Has not had an influenza immunization this season.   The following portions of the patient's history were reviewed and updated as appropriate: allergies, current medications, past family history, past medical history, past social history, past surgical history and problem list.           History reviewed. No pertinent past medical history.  There are no active problems to display for this patient.   Past Surgical History:  Procedure Laterality Date  . WISDOM TOOTH EXTRACTION      OB History    No data available       Home Medications    Prior to Admission medications   Medication Sig Start Date End Date Taking? Authorizing Provider  ibuprofen (ADVIL,MOTRIN) 600 MG tablet Take 1 tablet (600 mg total) by mouth every 6 (six) hours as needed. Patient taking differently: Take 600 mg by mouth every 6 (six) hours as needed for moderate pain.  06/20/16   Arby Barrette, MD  ondansetron (ZOFRAN ODT) 4 MG disintegrating tablet Take 1 tablet (4 mg total) by mouth every 4 (four) hours as needed for nausea or vomiting. 06/20/16   Arby Barrette, MD  PREVIFEM 0.25-35 MG-MCG tablet Take 1 tablet by mouth at bedtime.  06/12/16   [provider]    Family History History reviewed. No pertinent family history.  Social History Social History  Substance Use Topics  .  Smoking status: Never Smoker  . Smokeless tobacco: Never Used  . Alcohol use No     Allergies   Penicillins   Review of Systems Review of Systems  Constitutional: Positive for chills and fatigue. Negative for fever.  HENT: Positive for congestion and rhinorrhea.   Respiratory: Negative for cough and shortness of breath.   Cardiovascular: Negative for chest pain.  Musculoskeletal: Positive for myalgias.  All other systems reviewed and are negative.    Physical Exam Triage Vital Signs ED Triage Vitals  Enc Vitals Group     BP 01/15/17 1429 110/76     Pulse Rate 01/15/17 1429 81     Resp 01/15/17 1429 16     Temp 01/15/17 1429 98.5 F (36.9 C)     Temp Source 01/15/17 1429 Oral     SpO2 01/15/17 1429 98 %     Weight 01/15/17 1430 120 lb (54.4 kg)     Height 01/15/17 1430 5\' 1"  (1.549 m)     Head Circumference --      Peak Flow --      Pain Score --      Pain Loc --      Pain Edu? --      Excl. in GC? --    No data found.   Updated Vital Signs BP 110/76 (BP Location: Right Arm)   Pulse 81   Temp 98.5 F (36.9 C) (Oral)  Resp 16   Ht 5\' 1"  (1.549 m)   Wt 120 lb (54.4 kg)   SpO2 98%   BMI 22.67 kg/m   Visual Acuity Right Eye Distance:   Left Eye Distance:   Bilateral Distance:    Right Eye Near:   Left Eye Near:    Bilateral Near:     Physical Exam  Constitutional: She is oriented to person, place, and time. She appears well-developed and well-nourished.  HENT:  Mouth/Throat: Oropharynx is clear and moist. No oropharyngeal exudate.  Eyes: Conjunctivae are normal.  Neck: Normal range of motion. Neck supple.  Cardiovascular: Normal rate, regular rhythm and normal heart sounds.   Pulmonary/Chest: Effort normal and breath sounds normal.  Musculoskeletal: Normal range of motion.  Neurological: She is alert and oriented to person, place, and time.  Skin: Skin is warm and dry.  Psychiatric: She has a normal mood and affect.     UC Treatments /  Results  Labs (all labs ordered are listed, but only abnormal results are displayed) Labs Reviewed - No data to display  EKG  EKG Interpretation None       Radiology No results found.  Procedures Procedures (including critical care time)  Medications Ordered in UC Medications - No data to display   Initial Impression / Assessment and Plan / UC Course  I have reviewed the triage vital signs and the nursing notes.  Pertinent labs & imaging results that were available during my care of the patient were reviewed by me and considered in my medical decision making (see chart for details).     21 yo AAF with significant medical history presenting with flu-like illness x 48 hours. Patient is nontoxic appearing. Physical exam reassuring. Vital signs stable. Advised supportive care with appropriate antipyretics and fluids.  Discussed diagnosis and treatment with patient. All questions have been answered and all concerns have been addressed. The patient verbalized understanding and had no further questions   Final Clinical Impressions(s) / UC Diagnoses   Final diagnoses:  Flu-like symptoms    New Prescriptions New Prescriptions   No medications on file     Controlled Substance Prescriptions [place] Controlled Substance Registry consulted? Not Applicable   Lurline IdolMurrill, Taijuan Serviss, FNP 01/15/17 1453

## 2017-01-23 ENCOUNTER — Ambulatory Visit (HOSPITAL_COMMUNITY)
Admission: EM | Admit: 2017-01-23 | Discharge: 2017-01-23 | Disposition: A | Discharge status: Home / Self Care | Payer: BLUE CROSS/BLUE SHIELD | Attending: Family Medicine | Admitting: Family Medicine

## 2017-01-23 ENCOUNTER — Encounter (HOSPITAL_COMMUNITY): Payer: Self-pay | Admitting: Emergency Medicine

## 2017-01-23 DIAGNOSIS — J40 Bronchitis, not specified as acute or chronic: Secondary | ICD-10-CM

## 2017-01-23 MED ORDER — AZITHROMYCIN 250 MG PO TABS: 250.0000 mg | ORAL_TABLET | Freq: Every day | ORAL | 0 refills | Status: DC

## 2017-01-23 NOTE — ED Triage Notes (Signed)
Patient was seen 10/22.  Now chest hurting with coughing and yellow phlegm.  Facial pain, generally feeling bad, hot spells and chills.

## 2017-01-23 NOTE — ED Provider Notes (Signed)
[facility]    CSN: [id number] Arrival date & time: 01/23/17  1550     History   Chief Complaint Chief Complaint  Patient presents with  . URI    HPI Teresa Arroyo is a 21 y.o. female.   21 year-old female presenting today complaining of cold symptoms. Patient states that she was seen here on 10/22 for flu like symptoms including nasal congestion, subjective fever, chills, and body aches. She was given tamiflu but could not afford it so she did not get it filled. She states that went To [facility] for a parade and one day it was cold and rainy and then next day it was in the high 70s. She believes this worsened her symptoms. She is now complaining of sinus pain and pressure, nasal congestion, nonproductive cough, mild headache and body aches. No fever or chills    The history is provided by the patient.  URI  Presenting symptoms: congestion, cough, facial pain, fatigue, rhinorrhea and sore throat   Presenting symptoms: no ear pain and no fever   Severity:  Moderate Onset quality:  Gradual Duration:  8 days Timing:  Constant Progression:  Unchanged Chronicity:  New Relieved by:  Nothing Worsened by:  Nothing Ineffective treatments:  Decongestant and OTC medications Associated symptoms: myalgias and sinus pain   Associated symptoms: no arthralgias, no headaches, no neck pain, no sneezing, no swollen glands and no wheezing   Risk factors: not elderly, no chronic cardiac disease, no chronic kidney disease, no chronic respiratory disease, no diabetes mellitus, no immunosuppression, no recent illness, no recent travel and no sick contacts     History reviewed. No pertinent past medical history.  There are no active problems to display for this patient.   Past Surgical History:  Procedure Laterality Date  . WISDOM TOOTH EXTRACTION      OB History    No data available       Home Medications    Prior to Admission medications   Medication Sig Start Date End  Date Taking? Authorizing Provider  azithromycin (ZITHROMAX) 250 MG tablet Take 1 tablet (250 mg total) by mouth daily. Take first 2 tablets together, then 1 every day until finished. 01/23/17   Zariel Capano C, PA-C  ibuprofen (ADVIL,MOTRIN) 600 MG tablet Take 1 tablet (600 mg total) by mouth every 6 (six) hours as needed. Patient taking differently: Take 600 mg by mouth every 6 (six) hours as needed for moderate pain.  06/20/16   Arby Barrette, MD  ondansetron (ZOFRAN ODT) 4 MG disintegrating tablet Take 1 tablet (4 mg total) by mouth every 4 (four) hours as needed for nausea or vomiting. 06/20/16   Arby Barrette, MD  PREVIFEM 0.25-35 MG-MCG tablet Take 1 tablet by mouth at bedtime.  06/12/16   [provider]    Family History No family history on file.  Social History Social History  Substance Use Topics  . Smoking status: Never Smoker  . Smokeless tobacco: Never Used  . Alcohol use No     Allergies   Penicillins   Review of Systems Review of Systems  Constitutional: Positive for fatigue. Negative for chills and fever.  HENT: Positive for congestion, rhinorrhea, sinus pain and sore throat. Negative for ear pain and sneezing.   Eyes: Negative for pain and visual disturbance.  Respiratory: Positive for cough. Negative for shortness of breath and wheezing.   Cardiovascular: Negative for chest pain and palpitations.  Gastrointestinal: Negative for abdominal pain and vomiting.  Genitourinary:  Negative for dysuria and hematuria.  Musculoskeletal: Positive for myalgias. Negative for arthralgias, back pain and neck pain.  Skin: Negative for color change and rash.  Neurological: Negative for seizures, syncope and headaches.  All other systems reviewed and are negative.    Physical Exam Triage Vital Signs ED Triage Vitals  Enc Vitals Group     BP 01/23/17 1608 108/73     Pulse Rate 01/23/17 1608 84     Resp 01/23/17 1608 18     Temp 01/23/17 1608 99.3 F (37.4 C)      Temp Source 01/23/17 1608 Oral     SpO2 01/23/17 1608 100 %     Weight --      Height --      Head Circumference --      Peak Flow --      Pain Score 01/23/17 1607 8     Pain Loc --      Pain Edu? --      Excl. in GC? --    No data found.   Updated Vital Signs BP 108/73 (BP Location: Left Arm)   Pulse 84   Temp 99.3 F (37.4 C) (Oral)   Resp 18   SpO2 100%   Visual Acuity Right Eye Distance:   Left Eye Distance:   Bilateral Distance:    Right Eye Near:   Left Eye Near:    Bilateral Near:     Physical Exam  Constitutional: She appears well-developed and well-nourished. No distress.  HENT:  Head: Normocephalic and atraumatic.  Right Ear: Hearing, tympanic membrane, external ear and ear canal normal.  Left Ear: Hearing, tympanic membrane, external ear and ear canal normal.  Nose: Nose normal.  Mouth/Throat: Uvula is midline, oropharynx is clear and moist and mucous membranes are normal.  Eyes: Conjunctivae are normal.  Neck: Neck supple.  Cardiovascular: Normal rate and regular rhythm.   No murmur heard. Pulmonary/Chest: Effort normal and breath sounds normal. No respiratory distress. She has no decreased breath sounds. She has no wheezes. She has no rhonchi. She has no rales.  Abdominal: Soft. There is no tenderness.  Musculoskeletal: She exhibits no edema.  Neurological: She is alert.  Skin: Skin is warm and dry.  Psychiatric: She has a normal mood and affect.  Nursing note and vitals reviewed.    UC Treatments / Results  Labs (all labs ordered are listed, but only abnormal results are displayed) Labs Reviewed - No data to display  EKG  EKG Interpretation None       Radiology No results found.  Procedures Procedures (including critical care time)  Medications Ordered in UC Medications - No data to display   Initial Impression / Assessment and Plan / UC Course  I have reviewed the triage vital signs and the nursing notes.  Pertinent labs &  imaging results that were available during my care of the patient were reviewed by me and considered in my medical decision making (see chart for details).     Flu like symptoms per the patient. She has nasal congestion, cough, headache, body aches. She is afebrile and likely does not have the flu. Symptoms have been ongoing for 8 days and she is therefore out of the window for treatment with Tamiflu. I believe she has sinusitis vs bronchitis. Due to duration of symptoms, will treat with antibiotic Recommended OTC meds, tylenol, motrin, increased fluids Final Clinical Impressions(s) / UC Diagnoses   Final diagnoses:  Bronchitis    New Prescriptions  New Prescriptions   AZITHROMYCIN (ZITHROMAX) 250 MG TABLET    Take 1 tablet (250 mg total) by mouth daily. Take first 2 tablets together, then 1 every day until finished.     Controlled Substance Prescriptions [place] Controlled Substance Registry consulted? Not Applicable   Alecia Lemming, [place] 01/23/17 1191

## 2017-04-06 ENCOUNTER — Ambulatory Visit (HOSPITAL_COMMUNITY)
Admission: EM | Admit: 2017-04-06 | Discharge: 2017-04-06 | Disposition: A | Discharge status: Home / Self Care | Payer: BLUE CROSS/BLUE SHIELD | Attending: Internal Medicine | Admitting: Internal Medicine

## 2017-04-06 ENCOUNTER — Other Ambulatory Visit: Payer: Self-pay

## 2017-04-06 ENCOUNTER — Encounter (HOSPITAL_COMMUNITY): Payer: Self-pay | Admitting: Emergency Medicine

## 2017-04-06 DIAGNOSIS — B349 Viral infection, unspecified: Secondary | ICD-10-CM

## 2017-04-06 MED ORDER — IPRATROPIUM BROMIDE 0.06 % NA SOLN: 2.0000 | Freq: Four times a day (QID) | NASAL | 0 refills | Status: DC

## 2017-04-06 MED ORDER — FLUTICASONE PROPIONATE 50 MCG/ACT NA SUSP: 2.0000 | Freq: Every day | NASAL | 0 refills | Status: DC

## 2017-04-06 MED ORDER — BENZONATATE 100 MG PO CAPS: 100.0000 mg | ORAL_CAPSULE | Freq: Three times a day (TID) | ORAL | 0 refills | Status: DC

## 2017-04-06 NOTE — ED Triage Notes (Signed)
Pt reports nasal congestion, extreme headache, bilateral ear pain, sore throat, productive cough, and hot and cold flashes for three days.

## 2017-04-06 NOTE — ED Provider Notes (Signed)
[facility]    CSN: [id number] Arrival date & time: 04/06/17  1508     History   Chief Complaint Chief Complaint  Patient presents with  . URI    HPI Kelly SplinterBrooke Gow is a 22 y.o. female.   22 year old female comes in for 3-day history of URI symptoms.  She has had nasal congestion, headache, bilateral ear pain, sore throat, productive cough.  She has also had hot and cold flashes with subjective fever. otc cold medications with little relief. Denies history of asthma. Never smoker.        History reviewed. No pertinent past medical history.  There are no active problems to display for this patient.   Past Surgical History:  Procedure Laterality Date  . WISDOM TOOTH EXTRACTION      OB History    No data available       Home Medications    Prior to Admission medications   Medication Sig Start Date End Date Taking? Authorizing Provider  ibuprofen (ADVIL,MOTRIN) 600 MG tablet Take 1 tablet (600 mg total) by mouth every 6 (six) hours as needed. Patient taking differently: Take 600 mg by mouth every 6 (six) hours as needed for moderate pain.  06/20/16  Yes Arby BarrettePfeiffer, Marcy, MD  PREVIFEM 0.25-35 MG-MCG tablet Take 1 tablet by mouth at bedtime.  06/12/16  Yes [provider]  benzonatate (TESSALON) 100 MG capsule Take 1 capsule (100 mg total) by mouth every 8 (eight) hours. 04/06/17   Cathie HoopsYu, Amy V, PA-C  fluticasone (FLONASE) 50 MCG/ACT nasal spray Place 2 sprays into both nostrils daily. 04/06/17   Cathie HoopsYu, Amy V, PA-C  ipratropium (ATROVENT) 0.06 % nasal spray Place 2 sprays into both nostrils 4 (four) times daily. 04/06/17   Belinda FisherYu, Amy V, PA-C    Family History History reviewed. No pertinent family history.  Social History Social History   Tobacco Use  . Smoking status: Never Smoker  . Smokeless tobacco: Never Used  Substance Use Topics  . Alcohol use: No  . Drug use: No     Allergies   Penicillins   Review of Systems Review of Systems  Reason  unable to perform ROS: See HPI as above.     Physical Exam Triage Vital Signs ED Triage Vitals [04/06/17 1547]  Enc Vitals Group     BP 114/84     Pulse Rate 81     Resp      Temp 98.8 F (37.1 C)     Temp Source Oral     SpO2 100 %     Weight      Height      Head Circumference      Peak Flow      Pain Score 9     Pain Loc      Pain Edu?      Excl. in GC?    No data found.  Updated Vital Signs BP 114/84 (BP Location: Left Arm)   Pulse 81   Temp 98.8 F (37.1 C) (Oral)   LMP 03/09/2017 (Approximate)   SpO2 100%   Physical Exam  Constitutional: She is oriented to person, place, and time. She appears well-developed and well-nourished. No distress.  HENT:  Head: Normocephalic and atraumatic.  Right Ear: External ear and ear canal normal. Tympanic membrane is erythematous. Tympanic membrane is not bulging.  Left Ear: External ear and ear canal normal. Tympanic membrane is erythematous. Tympanic membrane is not bulging.  Nose: Mucosal edema and rhinorrhea present.  Right sinus exhibits maxillary sinus tenderness and frontal sinus tenderness. Left sinus exhibits maxillary sinus tenderness and frontal sinus tenderness.  Mouth/Throat: Uvula is midline, oropharynx is clear and moist and mucous membranes are normal.  Eyes: Conjunctivae are normal. Pupils are equal, round, and reactive to light.  Neck: Normal range of motion. Neck supple.  Cardiovascular: Normal rate, regular rhythm and normal heart sounds. Exam reveals no gallop and no friction rub.  No murmur heard. Pulmonary/Chest: Effort normal and breath sounds normal. She has no decreased breath sounds. She has no wheezes. She has no rhonchi. She has no rales.  Lymphadenopathy:    She has no cervical adenopathy.  Neurological: She is alert and oriented to person, place, and time.  Skin: Skin is warm and dry.  Psychiatric: She has a normal mood and affect. Her behavior is normal. Judgment normal.     UC Treatments /  Results  Labs (all labs ordered are listed, but only abnormal results are displayed) Labs Reviewed - No data to display  EKG  EKG Interpretation None       Radiology No results found.  Procedures Procedures (including critical care time)  Medications Ordered in UC Medications - No data to display   Initial Impression / Assessment and Plan / UC Course  I have reviewed the triage vital signs and the nursing notes.  Pertinent labs & imaging results that were available during my care of the patient were reviewed by me and considered in my medical decision making (see chart for details).    Discussed with patient history and exam most consistent with viral URI. Symptomatic treatment as needed. Push fluids. Return precautions given.   Final Clinical Impressions(s) / UC Diagnoses   Final diagnoses:  Viral syndrome    ED Discharge Orders        Ordered    fluticasone (FLONASE) 50 MCG/ACT nasal spray  Daily     04/06/17 1622    ipratropium (ATROVENT) 0.06 % nasal spray  4 times daily     04/06/17 1622    benzonatate (TESSALON) 100 MG capsule  Every 8 hours     04/06/17 1622        Belinda Fisher, PA-C 04/06/17 1625

## 2017-04-06 NOTE — Discharge Instructions (Signed)
Tessalon for cough. Start flonase, and atrovent nasal spray for nasal congestion. Continue over the counter decongestants. You can use over the counter nasal saline rinse such as neti pot for nasal congestion. Keep hydrated, your urine should be clear to pale yellow in color. Tylenol/motrin for fever and pain. Monitor for any worsening of symptoms, chest pain, shortness of breath, wheezing, swelling of the throat, follow up for reevaluation.   For sore throat try using a honey-based tea. Use 3 teaspoons of honey with juice squeezed from half lemon. Place shaved pieces of ginger into 1/2-1 cup of water and warm over stove top. Then mix the ingredients and repeat every 4 hours as needed.

## 2017-04-19 ENCOUNTER — Ambulatory Visit (HOSPITAL_COMMUNITY)
Admission: EM | Admit: 2017-04-19 | Discharge: 2017-04-19 | Disposition: A | Discharge status: Home / Self Care | Payer: BLUE CROSS/BLUE SHIELD | Attending: Family Medicine | Admitting: Family Medicine

## 2017-04-19 ENCOUNTER — Encounter (HOSPITAL_COMMUNITY): Payer: Self-pay | Admitting: Family Medicine

## 2017-04-19 DIAGNOSIS — L299 Pruritus, unspecified: Secondary | ICD-10-CM

## 2017-04-19 DIAGNOSIS — L42 Pityriasis rosea: Secondary | ICD-10-CM | POA: Diagnosis not present

## 2017-04-19 MED ORDER — PREDNISONE 10 MG (21) PO TBPK: ORAL_TABLET | ORAL | 0 refills | Status: DC

## 2017-04-19 NOTE — ED Triage Notes (Signed)
Pt here for rash all over body x 3 days. sts itchy. Using benadryl cream. No new meds, lotions, creams, foods.

## 2017-04-19 NOTE — ED Provider Notes (Signed)
  [facility]   [id number] 04/19/17 Arrival Time: 1312  ASSESSMENT & PLAN:  1. Pityriasis rosea   2. Itching    Meds ordered this encounter  Medications  . predniSONE (STERAPRED UNI-PAK 21 TAB) 10 MG (21) TBPK tablet    Sig: Take as directed.    Dispense:  21 tablet    Refill:  0   Written information given. Reviewed expectations re: course of current medical issues. Questions answered. Outlined signs and symptoms indicating need for more acute intervention. Patient verbalized understanding. After Visit Summary given.   SUBJECTIVE:  Teresa Arroyo is a 22 y.o. female who presents with complaint of:   Rash Patient presents for evaluation of a diffuse rash involving her torso/back mainly. Onset gradual, several days ago. Reports that the rash has not changed over time and is pruritic. Associated symptoms: none. She denies: abdominal pain, arthralgia, congestion, fever, headache, myalgia and sore throat. Reports that she has not had contacts with similar rash. She has not had new exposures (soaps, lotions, laundry detergents, foods, medications, plants, insects or animals). She @has  not identified precipitant. Environmental exposures or allergies: none No recent travel. No self treatment. No recent illnesses.  ROS: As per HPI.  OBJECTIVE: Vitals:   04/19/17 1322  BP: 106/75  Pulse: 77  Resp: 16  Temp: 98.4 F (36.9 C)  TempSrc: Oral  SpO2: 100%  Weight: 120 lb (54.4 kg)  Height: 5\' 1"  (1.549 m)    General appearance: alert; no distress Lungs: clear to auscultation bilaterally Heart: regular rate and rhythm Extremities: no edema Skin: warm and dry; crops of oval, sharply delimited, pink papules on trunk oriented along skin lines; mainly on torso Psychological: alert and cooperative; normal mood and affect  Allergies  Allergen Reactions  . Penicillins Hives     Social History   Socioeconomic History  . Marital status: Single    Spouse name: Not on  file  . Number of children: Not on file  . Years of education: Not on file  . Highest education level: Not on file  Social Needs  . Financial resource strain: Not on file  . Food insecurity - worry: Not on file  . Food insecurity - inability: Not on file  . Transportation needs - medical: Not on file  . Transportation needs - non-medical: Not on file  Occupational History  . Not on file  Tobacco Use  . Smoking status: Never Smoker  . Smokeless tobacco: Never Used  Substance and Sexual Activity  . Alcohol use: No  . Drug use: No  . Sexual activity: No  Other Topics Concern  . Not on file  Social History Narrative  . Not on file    Past Surgical History:  Procedure Laterality Date  . WISDOM TOOTH EXTRACTION       Mardella LaymanHagler, Maria Coin, MD 04/24/17 [phone]

## 2017-07-09 ENCOUNTER — Encounter (HOSPITAL_COMMUNITY): Payer: Self-pay | Admitting: Family Medicine

## 2017-07-09 ENCOUNTER — Ambulatory Visit (HOSPITAL_COMMUNITY)
Admission: EM | Admit: 2017-07-09 | Discharge: 2017-07-09 | Disposition: A | Discharge status: Home / Self Care | Payer: BLUE CROSS/BLUE SHIELD | Attending: Family Medicine | Admitting: Family Medicine

## 2017-07-09 DIAGNOSIS — J111 Influenza due to unidentified influenza virus with other respiratory manifestations: Secondary | ICD-10-CM

## 2017-07-09 DIAGNOSIS — R69 Illness, unspecified: Secondary | ICD-10-CM

## 2017-07-09 MED ORDER — HYDROCODONE-HOMATROPINE 5-1.5 MG/5ML PO SYRP: 5.0000 mL | ORAL_SOLUTION | Freq: Four times a day (QID) | ORAL | 0 refills | Status: AC | PRN

## 2017-07-09 NOTE — ED Provider Notes (Signed)
[facility]    CSN: [id number] Arrival date & time: 07/09/17  1918     History   Chief Complaint Chief Complaint  Patient presents with  . Cough    HPI Teresa Arroyo is a 22 y.o. female.   1 day history of dry cough low-grade fever sore throat headache myalgias.  Had similar symptoms last year.   HPI  History reviewed. No pertinent past medical history.  There are no active problems to display for this patient.   Past Surgical History:  Procedure Laterality Date  . WISDOM TOOTH EXTRACTION      OB History   None      Home Medications    Prior to Admission medications   Medication Sig Start Date End Date Taking? Authorizing Provider  ibuprofen (ADVIL,MOTRIN) 600 MG tablet Take 1 tablet (600 mg total) by mouth every 6 (six) hours as needed. Patient taking differently: Take 600 mg by mouth every 6 (six) hours as needed for moderate pain.  06/20/16   Arby Barrette, MD  PREVIFEM 0.25-35 MG-MCG tablet Take 1 tablet by mouth at bedtime.  06/12/16   [provider]    Family History History reviewed. No pertinent family history.  Social History Social History   Tobacco Use  . Smoking status: Never Smoker  . Smokeless tobacco: Never Used  Substance Use Topics  . Alcohol use: No  . Drug use: No     Allergies   Penicillins   Review of Systems Review of Systems  Constitutional: Negative.   HENT: Positive for congestion and sore throat.   Respiratory: Positive for cough.   Cardiovascular: Negative.   Musculoskeletal: Positive for myalgias.     Physical Exam Triage Vital Signs ED Triage Vitals  Enc Vitals Group     BP 07/09/17 2005 114/90     Pulse Rate 07/09/17 2005 80     Resp 07/09/17 2005 18     Temp 07/09/17 2005 99 F (37.2 C)     Temp src --      SpO2 07/09/17 2005 100 %     Weight --      Height --      Head Circumference --      Peak Flow --      Pain Score 07/09/17 2004 8     Pain Loc --      Pain Edu? --    Excl. in GC? --    No data found.  Updated Vital Signs BP 114/90   Pulse 80   Temp 99 F (37.2 C)   Resp 18   LMP 06/21/2017   SpO2 100%   Visual Acuity Right Eye Distance:   Left Eye Distance:   Bilateral Distance:    Right Eye Near:   Left Eye Near:    Bilateral Near:     Physical Exam  Constitutional: She appears well-developed and well-nourished.  HENT:  Head: Normocephalic.  Neck: Normal range of motion.  Cardiovascular: Normal rate, regular rhythm and normal heart sounds.  Pulmonary/Chest: Effort normal and breath sounds normal.  Abdominal: Soft.     UC Treatments / Results  Labs (all labs ordered are listed, but only abnormal results are displayed) Labs Reviewed - No data to display  EKG None Radiology No results found.  Procedures Procedures (including critical care time)  Medications Ordered in UC Medications - No data to display   Initial Impression / Assessment and Plan / UC Course  I have reviewed the triage  vital signs and the nursing notes.  Pertinent labs & imaging results that were available during my care of the patient were reviewed by me and considered in my medical decision making (see chart for details).     Flulike illness.  Offered Tamiflu but patient opted to treat symptoms with cough syrup rest fluids.  Final Clinical Impressions(s) / UC Diagnoses   Final diagnoses:  None    ED Discharge Orders    None       Controlled Substance Prescriptions [address] Controlled Substance Registry consulted? Yes, I have consulted the [place] Controlled Substances Registry for this patient, and feel the risk/benefit ratio today is favorable for proceeding with this prescription for a controlled substance.   Frederica KusterMiller, Afton Lavalle M, MD 07/09/17 2029

## 2017-07-09 NOTE — ED Triage Notes (Signed)
Pt here for cough and flu like symptoms since 1 am.

## 2019-01-10 IMAGING — CR DG CHEST 2V
2 series · 2 of 2 positions shown · non-contrast
Comparison: None

CLINICAL DATA: Intermittent moderate cough for 1 week, diagnosed
with influenza in URI, cough productive, subjective fever, decreased
appetite, chest tightness, ear pain, RIGHT lower quadrant abdominal
pain and burning, nausea and vomiting

EXAM:
CHEST  2 VIEW

[w chest pa]
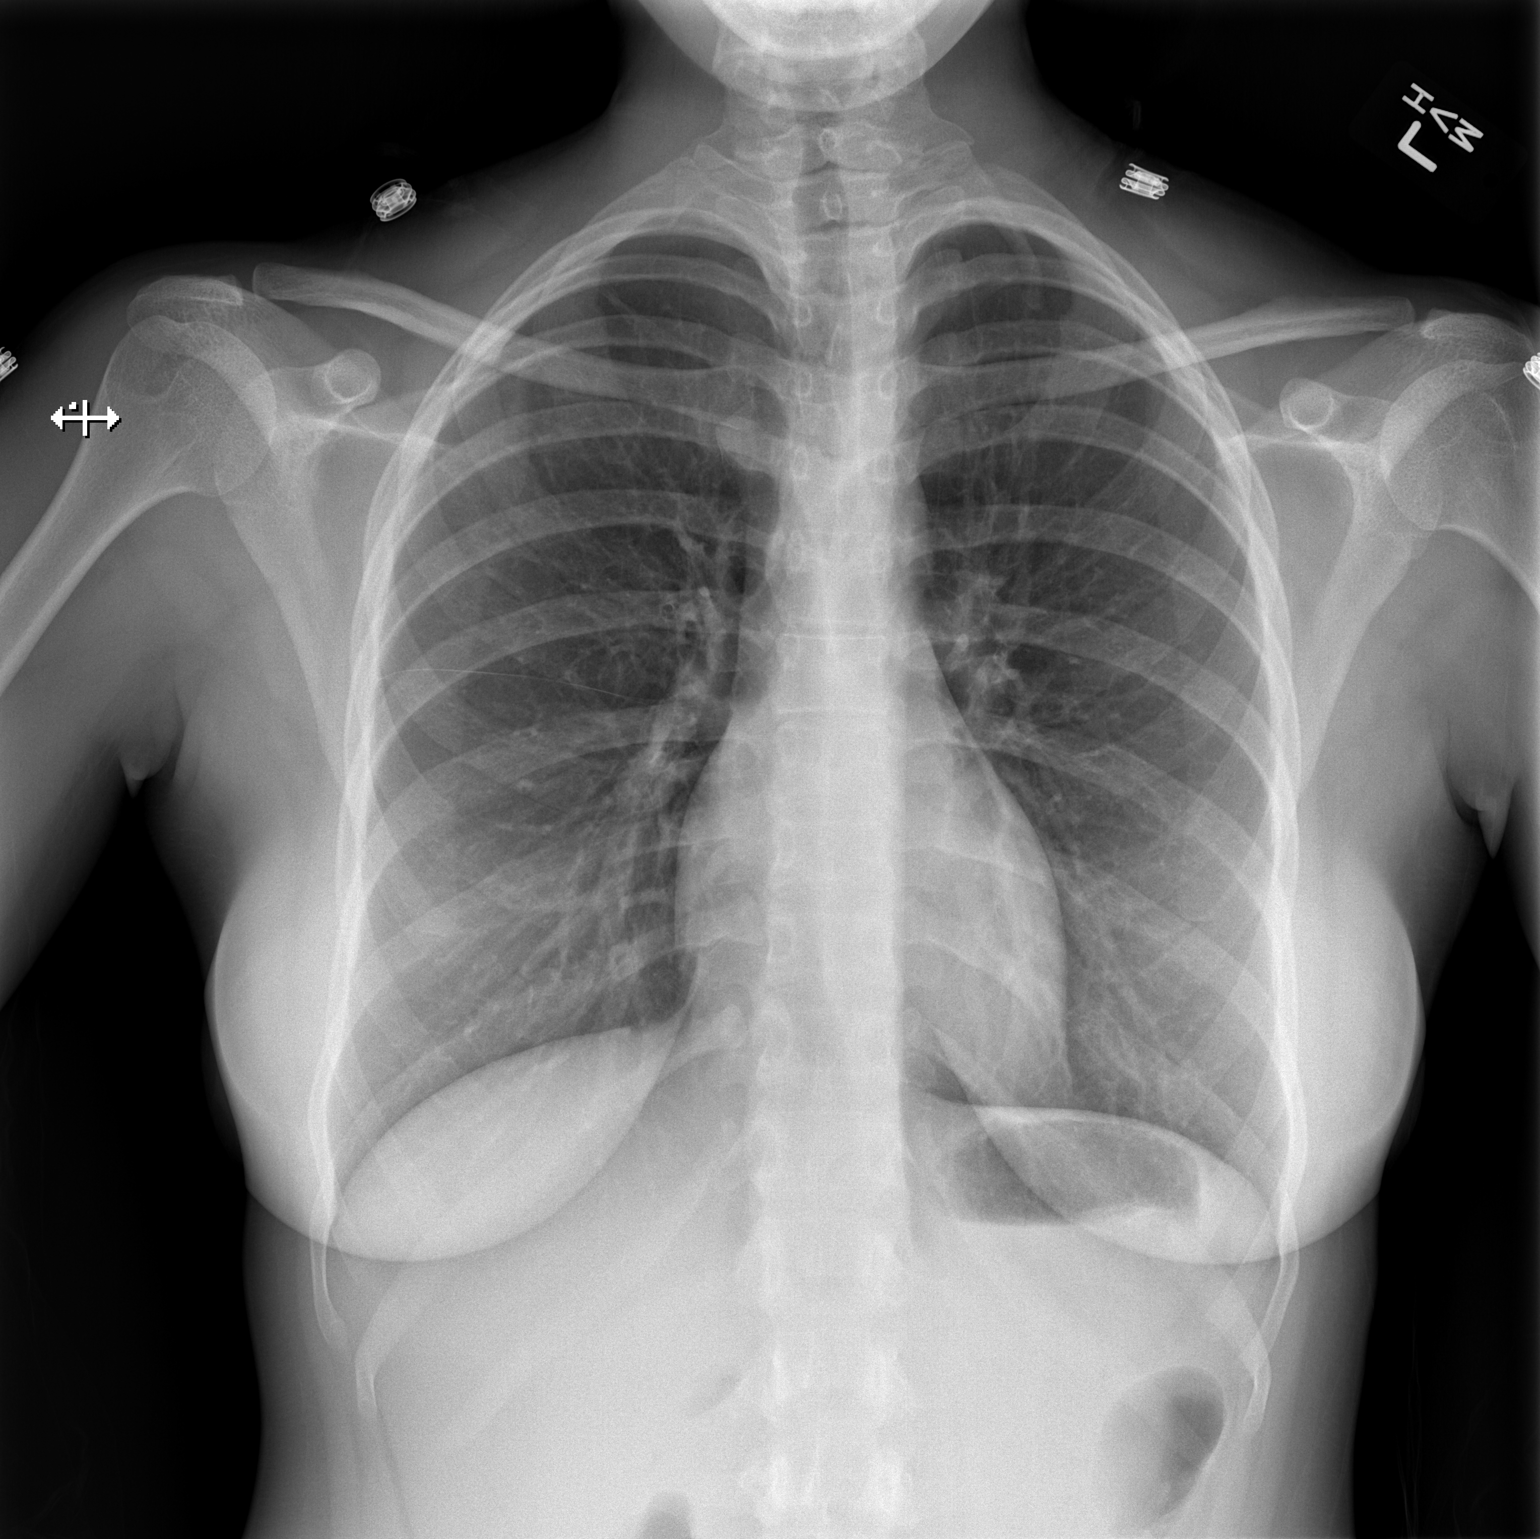

[w chest lat]
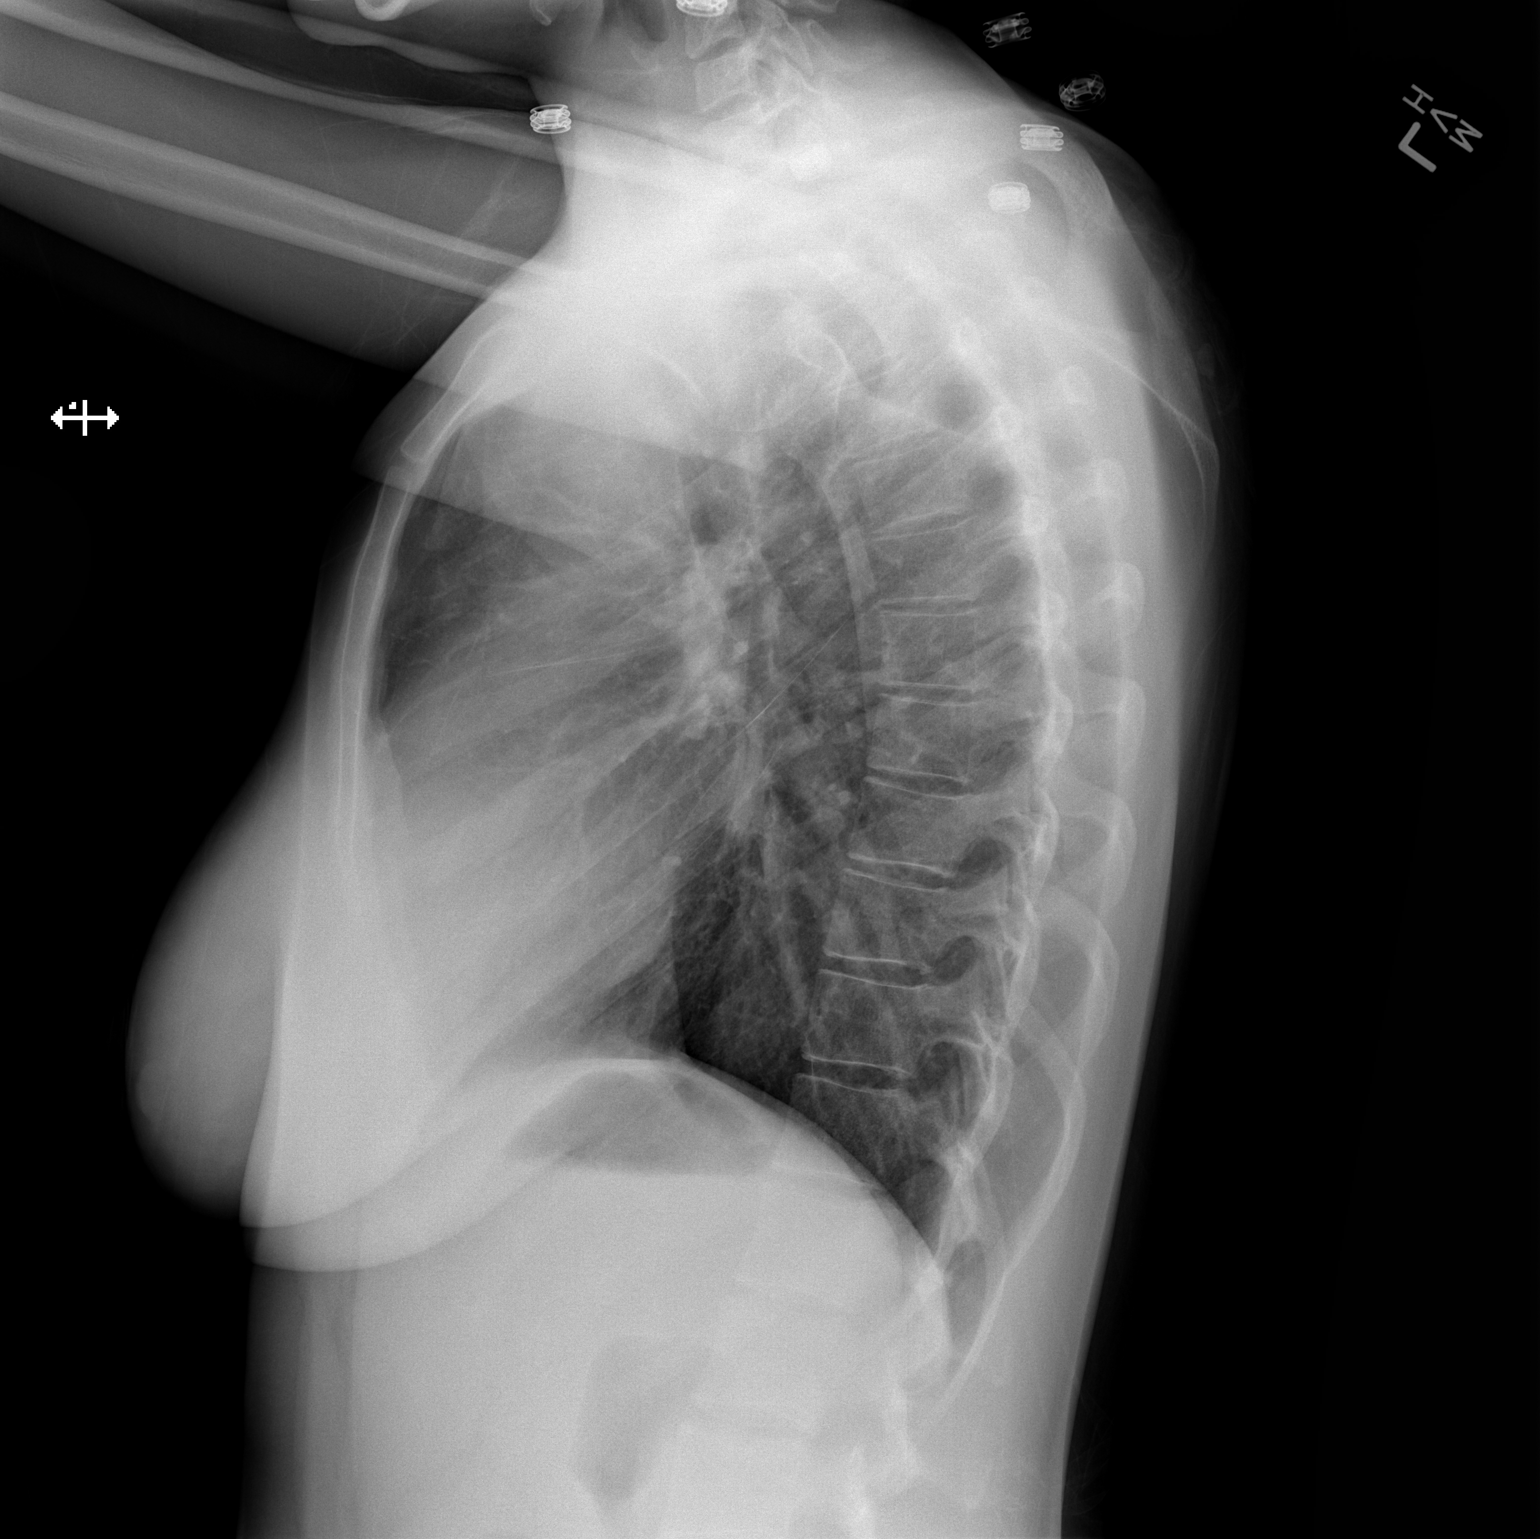

[2 of 2 positions shown; findings below may reference images not displayed]

FINDINGS: Normal heart size, mediastinal contours, and pulmonary vascularity.

Lungs minimally hyperinflated but clear.

No pleural effusion or pneumothorax.

Bones unremarkable.
IMPRESSION: Minimal hyperinflation without acute infiltrate.
